# Patient Record
Sex: Male | Born: 1970 | Race: White | Hispanic: No | Marital: Married | Smoking: Never smoker
Health system: Southern US, Community
[De-identification: ages and names within clinical notes are randomized; demographics above are authoritative.]

## PROBLEM LIST (undated history)

## (undated) DIAGNOSIS — N289 Disorder of kidney and ureter, unspecified: Secondary | ICD-10-CM

---

## 2017-09-23 ENCOUNTER — Emergency Department (HOSPITAL_COMMUNITY): Payer: BLUE CROSS/BLUE SHIELD

## 2017-09-23 ENCOUNTER — Other Ambulatory Visit: Payer: Self-pay

## 2017-09-23 ENCOUNTER — Emergency Department (HOSPITAL_COMMUNITY)
Admission: EM | Admit: 2017-09-23 | Discharge: 2017-09-23 | Disposition: A | Discharge status: Home / Self Care | Payer: BLUE CROSS/BLUE SHIELD | Attending: Emergency Medicine | Admitting: Emergency Medicine

## 2017-09-23 ENCOUNTER — Encounter (HOSPITAL_COMMUNITY): Payer: Self-pay

## 2017-09-23 DIAGNOSIS — R319 Hematuria, unspecified: Secondary | ICD-10-CM | POA: Diagnosis not present

## 2017-09-23 DIAGNOSIS — R109 Unspecified abdominal pain: Secondary | ICD-10-CM

## 2017-09-23 DIAGNOSIS — R1012 Left upper quadrant pain: Secondary | ICD-10-CM | POA: Diagnosis present

## 2017-09-23 HISTORY — DX: Disorder of kidney and ureter, unspecified: N28.9

## 2017-09-23 LAB — URINALYSIS, ROUTINE W REFLEX MICROSCOPIC
BILIRUBIN URINE: NEGATIVE
Bacteria, UA: NONE SEEN
Glucose, UA: NEGATIVE mg/dL
KETONES UR: NEGATIVE mg/dL
Leukocytes, UA: NEGATIVE
Nitrite: NEGATIVE
PH: 7 (ref 5.0–8.0)
Protein, ur: NEGATIVE mg/dL
SQUAMOUS EPITHELIAL / LPF: NONE SEEN
Specific Gravity, Urine: 1.019 (ref 1.005–1.030)

## 2017-09-23 LAB — CBC WITH DIFFERENTIAL/PLATELET
BASOS ABS: 0.1 10*3/uL (ref 0.0–0.1)
BASOS PCT: 1 %
EOS ABS: 0.5 10*3/uL (ref 0.0–0.7)
Eosinophils Relative: 5 %
HEMATOCRIT: 45.6 % (ref 39.0–52.0)
HEMOGLOBIN: 15.5 g/dL (ref 13.0–17.0)
Lymphocytes Relative: 27 %
Lymphs Abs: 2.5 10*3/uL (ref 0.7–4.0)
MCH: 28.9 pg (ref 26.0–34.0)
MCHC: 34 g/dL (ref 30.0–36.0)
MCV: 85.1 fL (ref 78.0–100.0)
MONOS PCT: 6 %
Monocytes Absolute: 0.6 10*3/uL (ref 0.1–1.0)
NEUTROS PCT: 61 %
Neutro Abs: 5.8 10*3/uL (ref 1.7–7.7)
Platelets: 283 10*3/uL (ref 150–400)
RBC: 5.36 MIL/uL (ref 4.22–5.81)
RDW: 12.8 % (ref 11.5–15.5)
WBC: 9.5 10*3/uL (ref 4.0–10.5)

## 2017-09-23 LAB — BASIC METABOLIC PANEL
ANION GAP: 9 (ref 5–15)
BUN: 28 mg/dL — ABNORMAL HIGH (ref 6–20)
CHLORIDE: 108 mmol/L (ref 101–111)
CO2: 21 mmol/L — AB (ref 22–32)
CREATININE: 1.06 mg/dL (ref 0.61–1.24)
Calcium: 9.2 mg/dL (ref 8.9–10.3)
GFR calc non Af Amer: >60 mL/min (ref >60)
Glucose, Bld: 95 mg/dL (ref 65–99)
POTASSIUM: 4.1 mmol/L (ref 3.5–5.1)
SODIUM: 138 mmol/L (ref 135–145)

## 2017-09-23 MED ORDER — ONDANSETRON HCL 4 MG PO TABS: 4.0000 mg | ORAL_TABLET | Freq: Three times a day (TID) | ORAL | 0 refills | Status: AC | PRN

## 2017-09-23 MED ORDER — SODIUM CHLORIDE 0.9 % IV BOLUS (SEPSIS)
1000.0000 mL | Freq: Once | INTRAVENOUS | Status: AC
  Administered 2017-09-23: 1000 mL via INTRAVENOUS

## 2017-09-23 MED ORDER — ONDANSETRON HCL 4 MG/2ML IJ SOLN
4.0000 mg | Freq: Once | INTRAMUSCULAR | Status: AC
  Administered 2017-09-23: 4 mg via INTRAVENOUS
  Filled 2017-09-23: qty 2

## 2017-09-23 MED ORDER — MORPHINE SULFATE (PF) 4 MG/ML IV SOLN
4.0000 mg | Freq: Once | INTRAVENOUS | Status: AC
  Administered 2017-09-23: 4 mg via INTRAVENOUS
  Filled 2017-09-23: qty 1

## 2017-09-23 MED ORDER — KETOROLAC TROMETHAMINE 30 MG/ML IJ SOLN
30.0000 mg | Freq: Once | INTRAMUSCULAR | Status: AC
  Administered 2017-09-23: 30 mg via INTRAVENOUS
  Filled 2017-09-23: qty 1

## 2017-09-23 MED ORDER — HYDROCODONE-ACETAMINOPHEN 5-325 MG PO TABS: 1.0000 | ORAL_TABLET | Freq: Four times a day (QID) | ORAL | 0 refills | Status: AC | PRN

## 2017-09-23 NOTE — Discharge Instructions (Addendum)
Please read and follow all provided instructions.  Your diagnoses today include:  1. Left flank pain   2. Hematuria, unspecified type     Tests performed today include: Urine test that showed blood in your urine and no infection Xray scan which showed a possible small kidney stone on the left side Blood test that showed normal kidney function Vital signs. See below for your results today.   Medications prescribed:   Take any prescribed medications only as directed.  You have been prescribed Norco for pain. This is an opioid pain medication. You may take this medication every 4-6 hours as needed for pain. Only take this medication if you need it for breakthrough pain. You may combine this medicine with ibuprofen, a non-steroidal anti-inflammatory drug (NSAID) every 6 hours, so you are getting something for pain relief every 3 hours.  Do not combine this medication with Tylenol, as it may increase the risk of liver problems.  Do not combine this medication with alcohol.  Please be advised to avoid driving or operating heavy machinery while taking this medication, as it may make you drowsy or impair judgment.   Zofran.  He may take this every 8 hours as needed for nausea.   Home care instructions:  Follow any educational materials contained in this packet.  Please double your fluid intake for the next several days. Strain your urine and save any stones that may pass.   BE VERY CAREFUL not to take multiple medicines containing Tylenol (also called acetaminophen). Doing so can lead to an overdose which can damage your liver and cause liver failure and possibly death.   Follow-up instructions: Please follow-up with your urologist or the urologist referral (provided on front page) in the next 1 week for further evaluation of your symptoms.  If you need to return to the Emergency Department, go to [facility] and not [facility]. The [facility]rolog[facility] are [facility] at [facility] and can better care for you at this location.  Return instructions:  If you need to return to the Emergency Department, go to [facility] and not [facility]. [facility] are [facility] and can better care for you at this location.  Please return to the Emergency Department if you experience worsening symptoms.  Please return if you develop fever or uncontrolled pain or vomiting. Please return if you have any other emergent concerns.  Additional Information:  Your vital signs today were: BP (!) 141/104 (BP Location: Right Arm)    Pulse 68    Temp 97.9 F (36.6 C) (Oral)    Resp 20    Ht 5' 1.75" (1.568 m)    Wt 68 kg (150 lb)    SpO2 99%    BMI 27.66 kg/m  If your blood pressure (BP) was elevated above 135/85 this visit, please have this repeated by your doctor within one month. --------------

## 2017-09-23 NOTE — ED Provider Notes (Signed)
[facility]-EMERGENCY DEPT Provider Note   CSN: [id number] Arrival date & time: 09/23/17  1236     History   Chief Complaint Chief Complaint  Patient presents with  . Flank Pain    HPI Clinton Bolton is a 47 y.o. male.  HPI   Patient is a 47 year old male with a history of recurrent nephrolithiasis, presenting for left flank pain.  Patient reports that episode is identical with his prior episodes of nephrolithiasis, which he experiences every couple years.  Patient reports last episode was in July 2017.  Patient is visiting from [place], as he is a [employer].  Patient reports that the pain began at 9:30 AM suddenly when he went to the bathroom and urinated.  Patient denies noting any hematuria.  Patient reports he has been nauseous without vomiting.  Patient denies any abdominal surgical history.  Patient denies any diarrhea, or blood in stools.  No remedies tried for symptoms prior to arrival.  Past Medical History:  Diagnosis Date  . Renal disorder     There are no active problems to display for this patient.   History reviewed. No pertinent surgical history.      Home Medications    Prior to Admission medications   Not on File    Family History No family history on file.  Social History Social History   Tobacco Use  . Smoking status: Never Smoker  . Smokeless tobacco: Never Used  Substance Use Topics  . Alcohol use: Never    Frequency: Never  . Drug use: Never     Allergies   Patient has no known allergies.   Review of Systems Review of Systems  Constitutional: Negative for chills and fever.  Gastrointestinal: Positive for abdominal pain and nausea. Negative for blood in stool, diarrhea and vomiting.  Genitourinary: Positive for flank pain. Negative for dysuria and hematuria.  Musculoskeletal: Negative for arthralgias and myalgias.  All other systems reviewed and are negative.    Physical Exam Updated Vital Signs BP  (!) 145/109 (BP Location: Right Arm)   Pulse 95   Temp 97.9 F (36.6 C) (Oral)   Resp (!) 22   Ht 5' 1.75" (1.568 m)   Wt 68 kg (150 lb)   SpO2 99%   BMI 27.66 kg/m   Physical Exam  Constitutional: He appears well-developed and well-nourished. No distress.  HENT:  Head: Normocephalic and atraumatic.  Mouth/Throat: Oropharynx is clear and moist.  Eyes: Pupils are equal, round, and reactive to light. Conjunctivae and EOM are normal.  Neck: Normal range of motion. Neck supple.  Cardiovascular: Normal rate, regular rhythm, S1 normal and S2 normal.  No murmur heard. Pulmonary/Chest: Effort normal and breath sounds normal. He has no wheezes. He has no rales.  Abdominal: Soft. He exhibits no distension. There is tenderness. There is no guarding.  To palpation in the left lateral abdomen and left lower quadrant.  Positive CVA tenderness.  Musculoskeletal: Normal range of motion. He exhibits no edema or deformity.  Lymphadenopathy:    He has no cervical adenopathy.  Neurological: He is alert.  Cranial nerves grossly intact. Patient moves extremities symmetrically and with good coordination.  Skin: Skin is warm and dry. No rash noted. No erythema.  Psychiatric: He has a normal mood and affect. His behavior is normal. Judgment and thought content normal.  Nursing note and vitals reviewed.    ED Treatments / Results  Labs (all labs ordered are listed, but only abnormal results are displayed)  Labs Reviewed  URINALYSIS, ROUTINE W REFLEX MICROSCOPIC - Abnormal; Notable for the following components:      Result Value   Hgb urine dipstick MODERATE (*)    All other components within normal limits  BASIC METABOLIC PANEL - Abnormal; Notable for the following components:   CO2 21 (*)    BUN 28 (*)    All other components within normal limits  CBC WITH DIFFERENTIAL/PLATELET    EKG None  Radiology Dg Abdomen 1 View  Result Date: 09/23/2017 CLINICAL DATA:  Left flank pain EXAM:  ABDOMEN - 1 VIEW COMPARISON:  None. FINDINGS: Multiple small calcifications overlying left kidney. Stool overlies the right kidney limiting evaluation of the right kidney. Possible small right renal calculi Possible calculus distal left ureter Normal bowel gas pattern.  Normal skeletal structures IMPRESSION: Small left renal calculi.  Possible calculus distal left ureter. Electronically Signed   By: Marlan Palau M.D.   On: 09/23/2017 14:25   US Renal  Result Date: 09/23/2017 CLINICAL DATA:  Nephrolithiasis EXAM: RENAL / URINARY TRACT ULTRASOUND COMPLETE COMPARISON:  None. FINDINGS: Right Kidney: Length: 9.6 cm. Echogenicity and renal cortical thickness are within normal limits. No mass, perinephric fluid, or hydronephrosis visualized. No sonographically demonstrable calculus or ureterectasis. Left Kidney: Length: 10.9 cm. Echogenicity and renal cortical thickness are within normal limits. No mass, perinephric fluid, or hydronephrosis visualized. No sonographically demonstrable calculus or ureterectasis. Bladder: Appears normal for degree of bladder distention. IMPRESSION: Study within normal limits. Electronically Signed   By: Bretta Bang III M.D.   On: 09/23/2017 15:19    Procedures Procedures (including critical care time)  Medications Ordered in ED Medications  sodium chloride 0.9 % bolus 1,000 mL (has no administration in time range)  sodium chloride 0.9 % bolus 1,000 mL (has no administration in time range)  ondansetron (ZOFRAN) injection 4 mg (4 mg Intravenous Given 09/23/17 1351)  ketorolac (TORADOL) 30 MG/ML injection 30 mg (30 mg Intravenous Given 09/23/17 1351)  morphine 4 MG/ML injection 4 mg (4 mg Intravenous Given 09/23/17 1356)     Initial Impression / Assessment and Plan / ED Course  I have reviewed the triage vital signs and the nursing notes.  Pertinent labs & imaging results that were available during my care of the patient were reviewed by me and considered in my  medical decision making (see chart for details).  Clinical Course as of Sep 23 1740  Fri Sep 23, 2017  1546 I have reviewed the patient's information in the [place] Controlled Substance Database for the past 12 months and found them to have No Rx.  Opiates were prescribed for an acute, painful condition. The patient was given information on side effects and encouraged to use other, non-opiate pain medication primary, only using opiate medicine sparingly for severe pain. Patient given instructions not to drive, drink alcohol, or operate machinery while taking this medication.   [AM]  1644 Patient reassessed and is in slightly more pain at this time.  Patient did not receive the initial fluids ordered, so will proceed with fluid repletion before patient is discharged.   [AM]  1645 Return precautions given for any fevers, chills, intractable nausea vomiting, blood in stool, or migratory abdominal pain.   [AM]    Clinical Course User Index [AM] Elisha Ponder, PA-C    Patient is nontoxic-appearing, afebrile, but in significant pain.  Patient describing pain consistent with his prior episodes of kidney stones.  Will assess kidney function and urinalysis to support  evidence of nephrolithiasis as well as obtain KUB and renal ultrasound.  KUB showing possible small left distal ureter stone.  There is no evidence of hydronephrosis.  Urinalysis showing moderate hemoglobin.  Kidney function is normal.  No leukocytosis.  Engaged in shared decision making with patient regarding symptomatic treatment versus utility of scan.  Patient reports that he would like to defer scan at this time, obtain cinematic treatment and follow-up with urologist in [place].  I discussed that this is reasonable with close return precautions.  This is a supervised visit with Dr. Benjiman CoreNathan Pickering. Evaluation, management, and discharge planning discussed with this attending physician.  Final Clinical Impressions(s) / ED  Diagnoses   Final diagnoses:  Left flank pain  Hematuria, unspecified type    ED Discharge Orders        Ordered    HYDROcodone-acetaminophen (NORCO/VICODIN) 5-325 MG tablet  Every 6 hours PRN     09/23/17 1741    ondansetron (ZOFRAN) 4 MG tablet  Every 8 hours PRN     09/23/17 1741       Delia ChimesMurray, Alyssa B, PA-C 09/23/17 1742    Benjiman CorePickering, Nathan, MD 09/26/17 2146

## 2017-09-23 NOTE — ED Notes (Signed)
Bed: WA11 Expected date:  Expected time:  Means of arrival:  Comments: Hold for triage 

## 2017-09-23 NOTE — ED Triage Notes (Signed)
Patient c/o left flank pain at 0930 and has gotten progressively worse. Patient reports known multiple kidney stones. Patient states he voided a small amount approx 1 hour ago.

## 2018-07-25 IMAGING — US US RENAL
1 series · 14 of 25 positions shown · non-contrast
Comparison: None.

CLINICAL DATA: Nephrolithiasis

EXAM:
RENAL / URINARY TRACT ULTRASOUND COMPLETE

[Series 1: us renal · 14 of 72 slices shown]
[im 1/72]
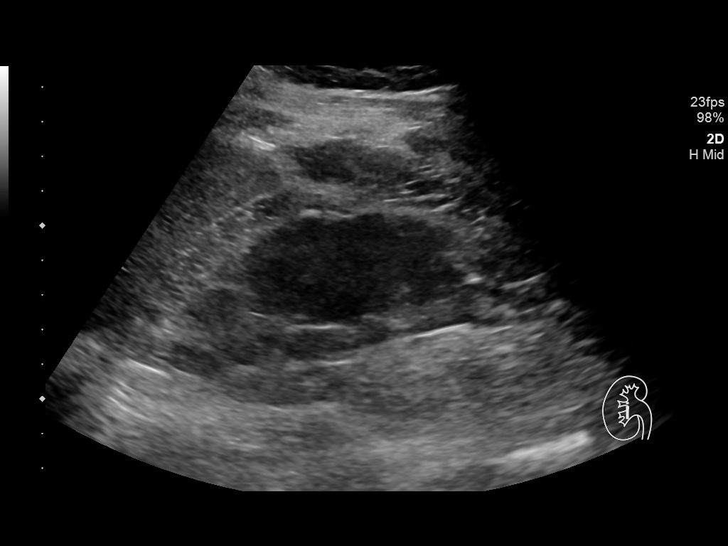
[im 6/72]
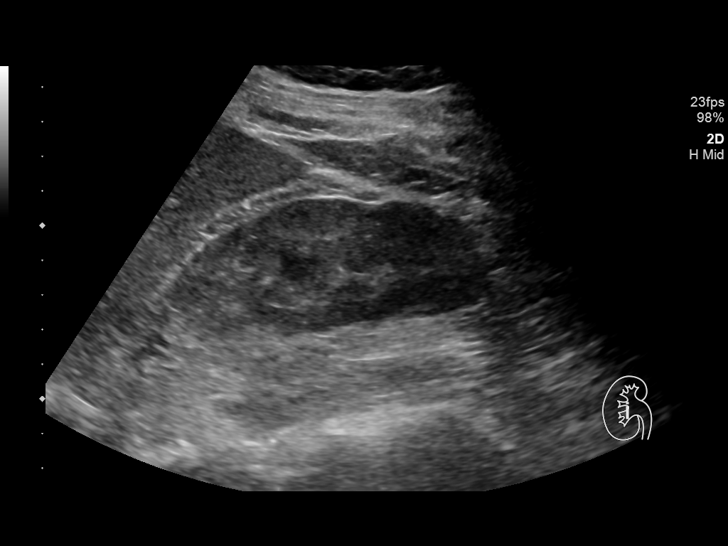
[im 12/72]
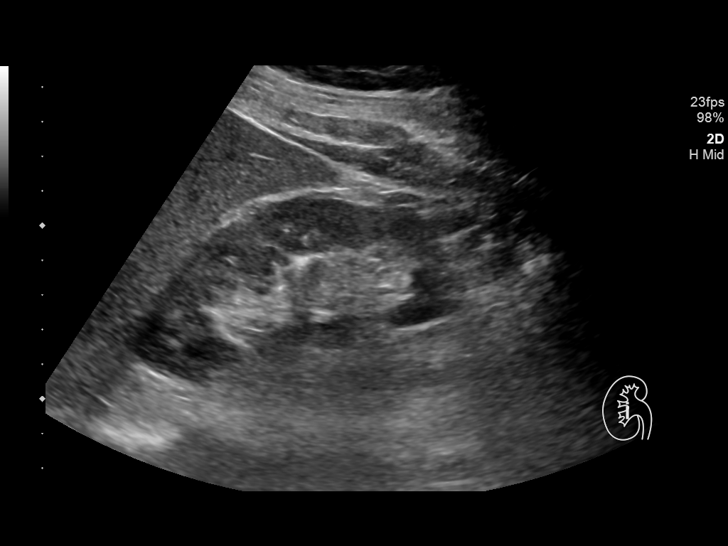
[im 18/72]
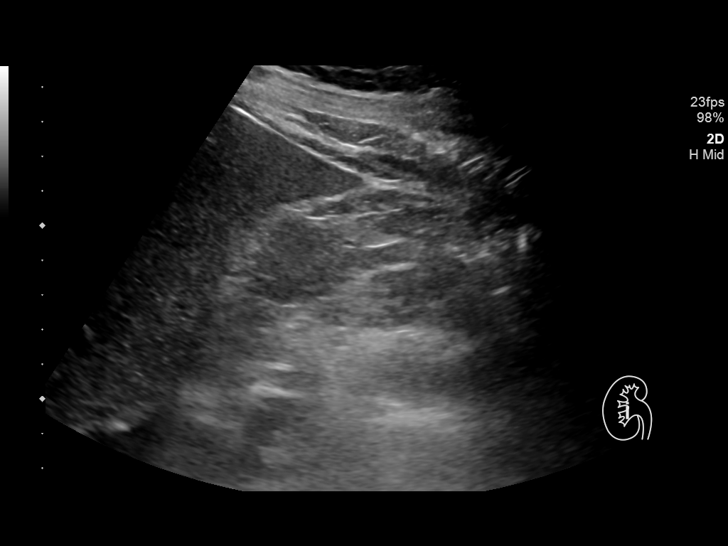
[im 24/72]
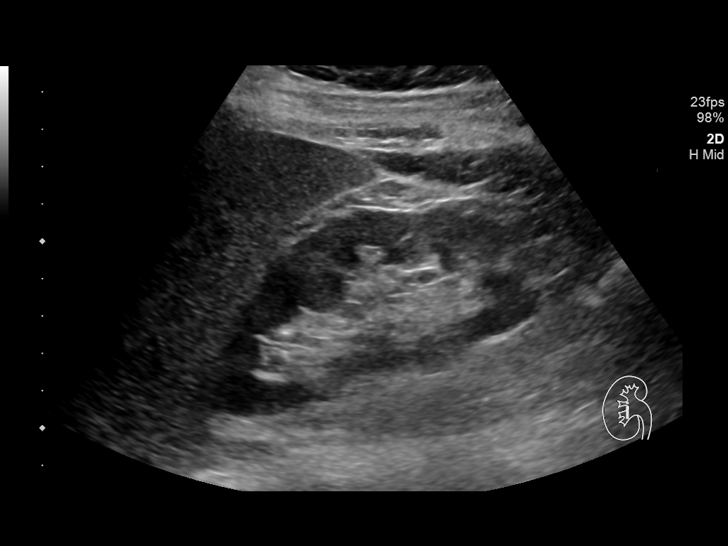
[im 27/72]
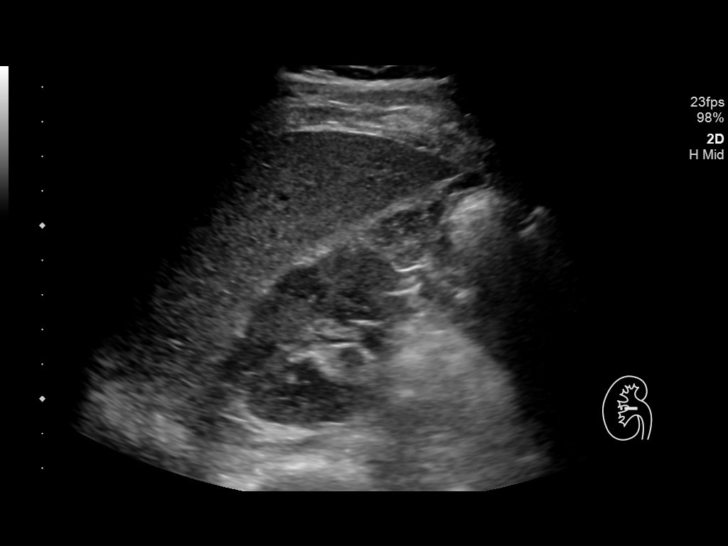
[im 33/72]
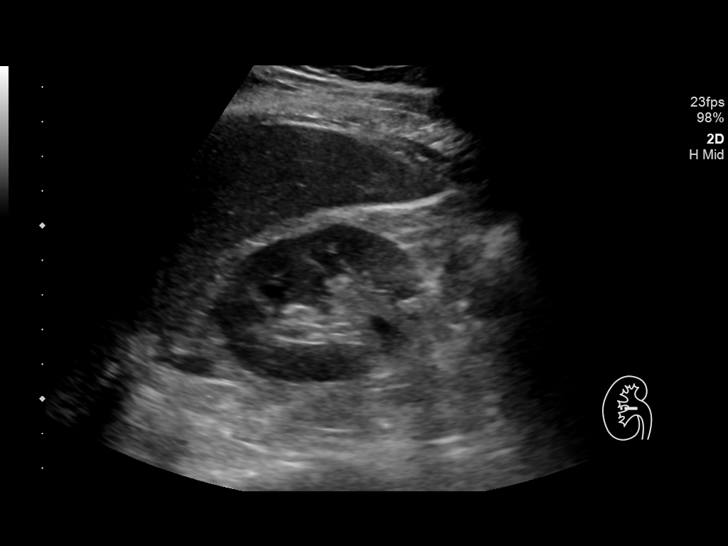
[im 39/72]
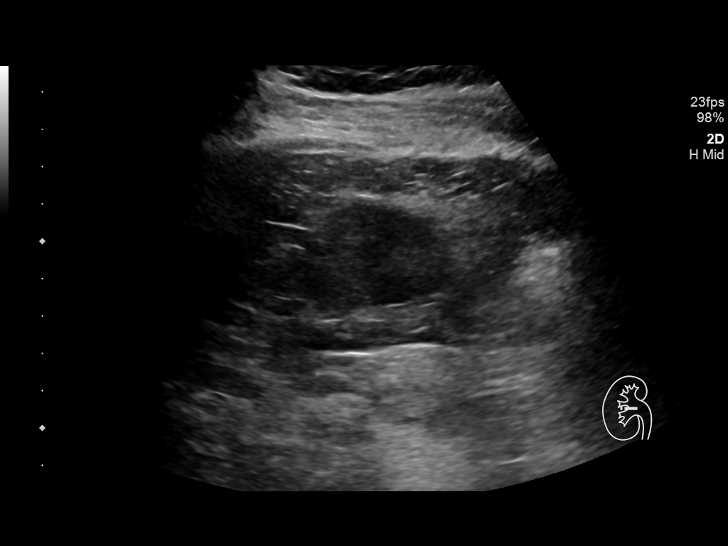
[im 45/72]
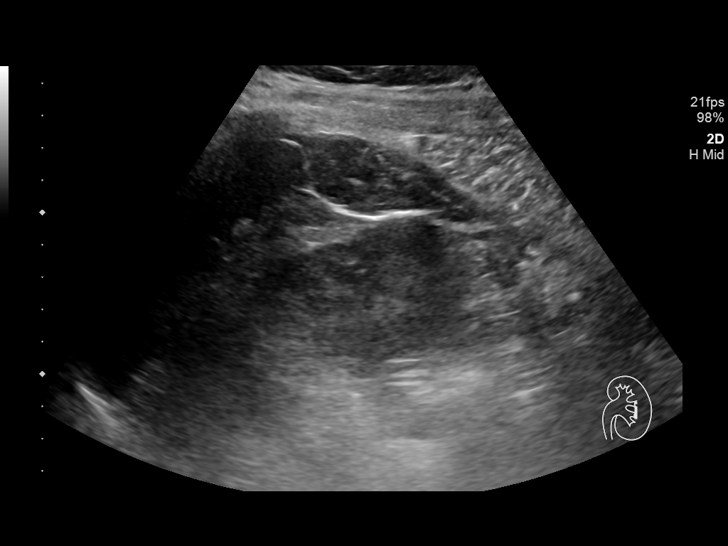
[im 48/72]
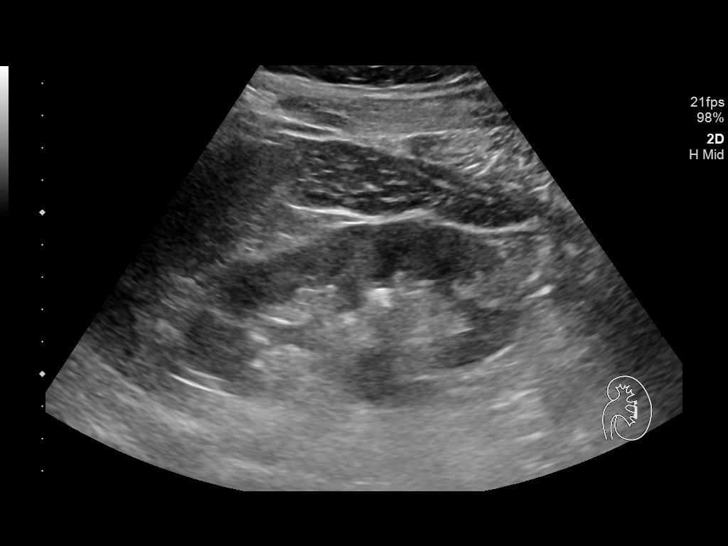
[im 54/72]
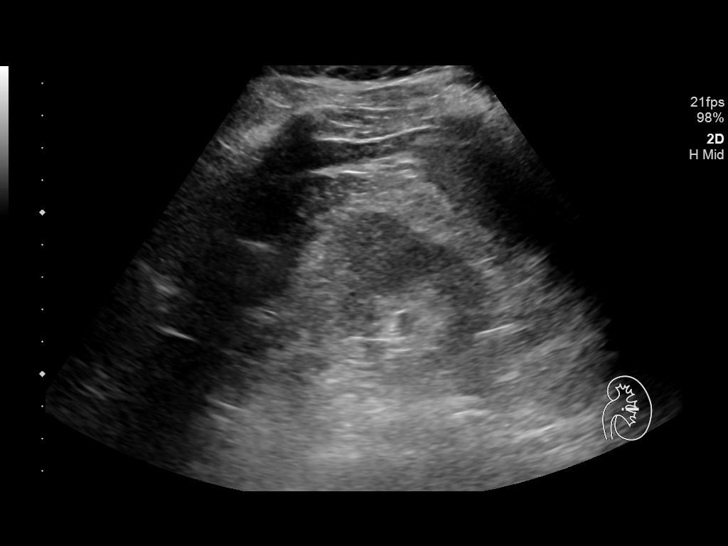
[im 60/72]
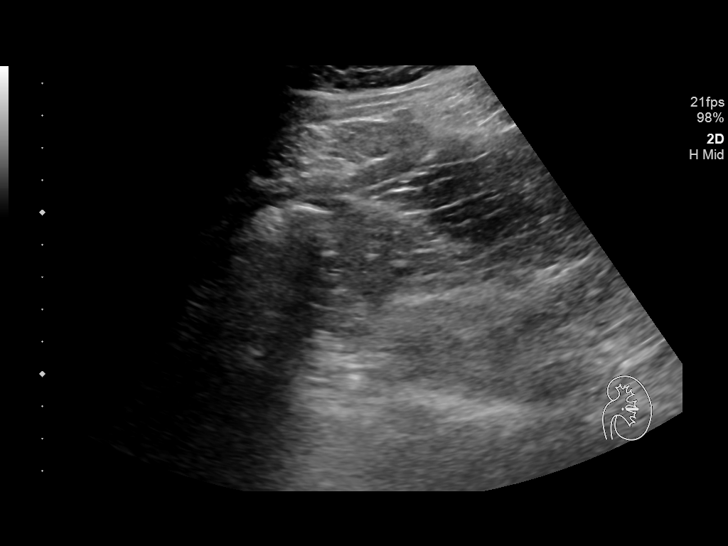
[im 66/72]
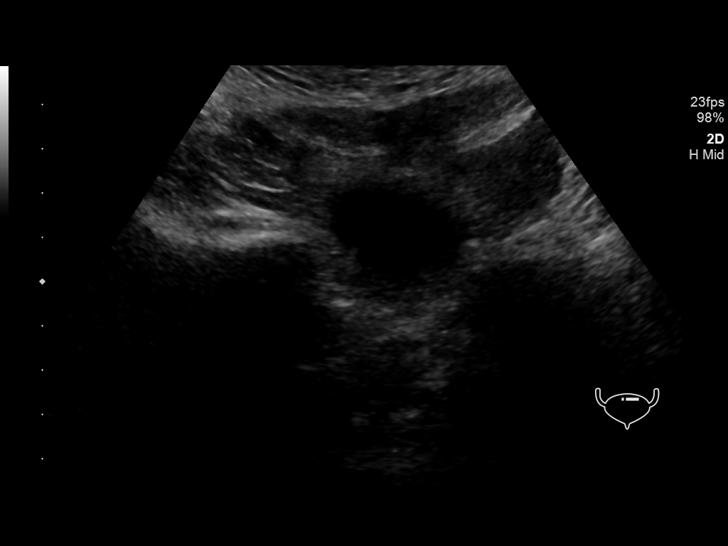
[im 72/72]
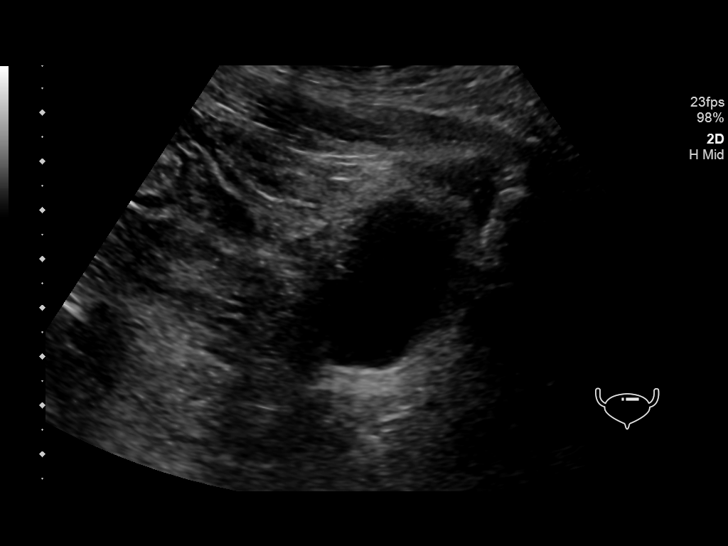

[14 of 25 positions shown; findings below may reference images not displayed]

FINDINGS: Right Kidney:

Length: 9.6 cm. Echogenicity and renal cortical thickness are within
normal limits. No mass, perinephric fluid, or hydronephrosis
visualized. No sonographically demonstrable calculus or
ureterectasis.

Left Kidney:

Length: 10.9 cm. Echogenicity and renal cortical thickness are
within normal limits. No mass, perinephric fluid, or hydronephrosis
visualized. No sonographically demonstrable calculus or
ureterectasis.

Bladder:

Appears normal for degree of bladder distention.
IMPRESSION: Study within normal limits.
